# Patient Record
Sex: Female | Born: 1990 | Race: White | Hispanic: No | Marital: Married
Health system: Midwestern US, Community
[De-identification: ages and names within clinical notes are randomized; demographics above are authoritative.]

## PROBLEM LIST (undated history)

## (undated) DIAGNOSIS — N3000 Acute cystitis without hematuria: Secondary | ICD-10-CM

---

## 2017-06-25 ENCOUNTER — Emergency Department

## 2017-06-25 ENCOUNTER — Emergency Department: Admit: 2017-06-25 | Payer: TRICARE (CHAMPUS) | Primary: Family Medicine

## 2017-06-25 ENCOUNTER — Inpatient Hospital Stay
Admit: 2017-06-25 | Discharge: 2017-06-25 | Disposition: A | Discharge status: Home / Self Care | Payer: TRICARE (CHAMPUS) | Attending: Emergency Medicine

## 2017-06-25 DIAGNOSIS — O2312 Infections of bladder in pregnancy, second trimester: Secondary | ICD-10-CM

## 2017-06-25 LAB — CBC WITH AUTOMATED DIFF
ABS. BASOPHILS: 0 10*3/uL (ref 0.0–0.1)
ABS. EOSINOPHILS: 0.1 10*3/uL (ref 0.0–0.4)
ABS. IMM. GRANS.: 0 10*3/uL (ref 0.00–0.04)
ABS. LYMPHOCYTES: 1.5 10*3/uL (ref 0.8–3.5)
ABS. MONOCYTES: 0.4 10*3/uL (ref 0.0–1.0)
ABS. NEUTROPHILS: 5.5 10*3/uL (ref 1.8–8.0)
ABSOLUTE NRBC: 0 10*3/uL (ref 0.00–0.01)
BASOPHILS: 0 % (ref 0–1)
EOSINOPHILS: 2 % (ref 0–7)
HCT: 34.9 % — ABNORMAL LOW (ref 35.0–47.0)
HGB: 11.8 g/dL (ref 11.5–16.0)
IMMATURE GRANULOCYTES: 0 % (ref 0.0–0.5)
LYMPHOCYTES: 20 % (ref 12–49)
MCH: 32.3 PG (ref 26.0–34.0)
MCHC: 33.8 g/dL (ref 30.0–36.5)
MCV: 95.6 FL (ref 80.0–99.0)
MONOCYTES: 5 % (ref 5–13)
MPV: 9.5 FL (ref 8.9–12.9)
NEUTROPHILS: 73 % (ref 32–75)
NRBC: 0 PER 100 WBC
PLATELET: 180 10*3/uL (ref 150–400)
RBC: 3.65 M/uL — ABNORMAL LOW (ref 3.80–5.20)
RDW: 12.4 % (ref 11.5–14.5)
WBC: 7.5 10*3/uL (ref 3.6–11.0)

## 2017-06-25 LAB — URINALYSIS W/ RFLX MICROSCOPIC
Bilirubin: NEGATIVE
Blood: NEGATIVE
Glucose: NEGATIVE mg/dL
Ketone: NEGATIVE mg/dL
Nitrites: NEGATIVE
Protein: NEGATIVE mg/dL
Specific gravity: 1.013 (ref 1.003–1.030)
Urobilinogen: 0.2 EU/dL (ref 0.2–1.0)
pH (UA): 5.5 (ref 5.0–8.0)

## 2017-06-25 LAB — METABOLIC PANEL, COMPREHENSIVE
A-G Ratio: 0.8 — ABNORMAL LOW (ref 1.1–2.2)
ALT (SGPT): 42 U/L (ref 12–78)
AST (SGOT): 25 U/L (ref 15–37)
Albumin: 3 g/dL — ABNORMAL LOW (ref 3.5–5.0)
Alk. phosphatase: 51 U/L (ref 45–117)
Anion gap: 6 mmol/L (ref 5–15)
BUN/Creatinine ratio: 20 (ref 12–20)
BUN: 13 MG/DL (ref 6–20)
Bilirubin, total: 0.1 MG/DL — ABNORMAL LOW (ref 0.2–1.0)
CO2: 25 mmol/L (ref 21–32)
Calcium: 8.5 MG/DL (ref 8.5–10.1)
Chloride: 109 mmol/L — ABNORMAL HIGH (ref 97–108)
Creatinine: 0.65 MG/DL (ref 0.55–1.02)
GFR est AA: >60 mL/min/{1.73_m2} (ref >60)
GFR est non-AA: >60 mL/min/{1.73_m2} (ref >60)
Globulin: 3.9 g/dL (ref 2.0–4.0)
Glucose: 76 mg/dL (ref 65–100)
Potassium: 3.8 mmol/L (ref 3.5–5.1)
Protein, total: 6.9 g/dL (ref 6.4–8.2)
Sodium: 140 mmol/L (ref 136–145)

## 2017-06-25 LAB — SAMPLES BEING HELD

## 2017-06-25 LAB — BETA HCG, QT
Beta HCG, QT: 24396 m[IU]/mL — ABNORMAL HIGH (ref 0–6)
hCG Quant: 24396 m[IU]/mL — ABNORMAL HIGH (ref 0–6)

## 2017-06-25 LAB — RUPTURE OF FETAL MEMBRANES, POC
Kit Lot No.: 56006800
Rupture of fetal membrane: NEGATIVE

## 2017-06-25 LAB — URINE CULTURE HOLD SAMPLE

## 2017-06-25 MED ORDER — CEPHALEXIN 500 MG CAP
500 mg | ORAL_CAPSULE | Freq: Four times a day (QID) | ORAL | 0 refills | Status: AC
Start: 2017-06-25 — End: 2017-07-02

## 2017-06-25 NOTE — ED Notes (Signed)
Discharge instructions reviewed by provider; pt verbalized understanding of discharge and medication use. Vitals updated, IV DC'ed and pt ambulated from department with steady gait. No apparent distress noted.

## 2017-06-25 NOTE — ED Provider Notes (Signed)
26 y.o. female with past medical history significant for mild hypothyroidism who presents from home with chief complaint of pregnancy problem.  Patient states that she is currently 17.[redacted] weeks pregnant.  She complains that she has been "leaking a clear fluid" for the past 2 days and believes this fluid to be leaking vaginally.  She states that she "does not feel as if she is using the bathroom" and only finds the fluid when checking her pants.  She notes that the fluid is present in her underwear everytime she uses the restroom and checks her underwear.  She states that the leakage causes her "underwear to be very wet."  She denies recent cramping and notes that this does not feel like she is having contractions.  She does mention experiencing some "stretching sensations" throughout her pregnancy.  Of note, patient is from [place] and has not yet been evaluated by her OBGYN at home.  Patient denies fever, chills, nausea, vomiting, diarrhea, constipation, abdominal pain, and chest pain.  There are no other acute medical concerns at this time.    Social hx: negative tobacco use; negative alcohol use; negative drug use  PCP: No primary care provider on file.    Note written by Richardean ChimeraFranklin Gergoudis, Scribe, as dictated by Thornton PapasEljaiek, Delton Stelle F, MD 12:33 PM        The history is provided by the patient and the spouse. No language interpreter was used.        Past Medical History:   Diagnosis Date   ??? Hypothyroid in pregnancy, antepartum        Past Surgical History:   Procedure Laterality Date   ??? HX CYST REMOVAL Right     right wrist         History reviewed. No pertinent family history.    Social History     Socioeconomic History   ??? Marital status: Not on file     Spouse name: Not on file   ??? Number of children: Not on file   ??? Years of education: Not on file   ??? Highest education level: Not on file   Social Needs   ??? Financial resource strain: Not on file   ??? Food insecurity - worry: Not on file    ??? Food insecurity - inability: Not on file   ??? Transportation needs - medical: Not on file   ??? Transportation needs - non-medical: Not on file   Occupational History   ??? Not on file   Tobacco Use   ??? Smoking status: Never Smoker   ??? Smokeless tobacco: Never Used   Substance and Sexual Activity   ??? Alcohol use: No     Frequency: Never   ??? Drug use: No   ??? Sexual activity: Not on file   Other Topics Concern   ??? Not on file   Social History Narrative   ??? Not on file         ALLERGIES: Patient has no known allergies.    Review of Systems   Constitutional: Negative for chills, diaphoresis and fever.   HENT: Negative for congestion, postnasal drip, rhinorrhea and sore throat.    Eyes: Negative for photophobia, discharge, redness and visual disturbance.   Respiratory: Negative for cough, chest tightness, shortness of breath and wheezing.    Cardiovascular: Negative for chest pain, palpitations and leg swelling.   Gastrointestinal: Negative for abdominal distention, abdominal pain, blood in stool, constipation, diarrhea, nausea and vomiting.   Genitourinary: Positive for vaginal  discharge ("Leaking clear fluid"). Negative for difficulty urinating, dysuria, frequency, hematuria and urgency.   Musculoskeletal: Negative for arthralgias, back pain, joint swelling and myalgias.   Skin: Negative for color change and rash.   Neurological: Negative for dizziness, speech difficulty, weakness, light-headedness, numbness and headaches.   Psychiatric/Behavioral: Negative for confusion. The patient is not nervous/anxious.    All other systems reviewed and are negative.      Vitals:    06/25/17 1223 06/25/17 1237   Pulse: (!) 104 92   Resp:  16   Temp:  98.5 ??F (36.9 ??C)   SpO2: 100% 100%   Weight:  90.7 kg (200 lb)   Height:  5\' 10"  (1.778 m)            Physical Exam   Constitutional: She is oriented to person, place, and time. She appears well-developed and well-nourished. No distress.   HENT:   Head: Normocephalic and atraumatic.    Right Ear: External ear normal.   Left Ear: External ear normal.   Nose: Nose normal.   Mouth/Throat: Oropharynx is clear and moist.   Eyes: Conjunctivae and EOM are normal. Pupils are equal, round, and reactive to light. No scleral icterus.   Neck: Normal range of motion. Neck supple. No JVD present. No tracheal deviation present. No thyromegaly present.   Cardiovascular: Normal rate, regular rhythm and normal heart sounds. Exam reveals no gallop and no friction rub.   No murmur heard.  Pulmonary/Chest: Effort normal and breath sounds normal. No respiratory distress. She has no wheezes. She has no rales. She exhibits no tenderness.   Abdominal: Soft. Bowel sounds are normal. She exhibits no distension and no mass. There is no tenderness. There is no rebound and no guarding.   Pelvic deferred after amniotic fluid testing result was negative   Musculoskeletal: Normal range of motion. She exhibits no edema or tenderness.   Lymphadenopathy:     She has no cervical adenopathy.   Neurological: She is alert and oriented to person, place, and time. She has normal strength. She displays no atrophy and no tremor. No cranial nerve deficit. She exhibits normal muscle tone. Coordination and gait normal.   Skin: Skin is warm and dry. No rash noted. She is not diaphoretic. No erythema.   Psychiatric: She has a normal mood and affect. Her behavior is normal. Judgment and thought content normal.   Nursing note and vitals reviewed.     Note written by Richardean ChimeraFranklin Gergoudis, Scribe, as dictated by Thornton PapasEljaiek, Nitisha Civello F, MD 12:33 PM      MDM  Number of Diagnoses or Management Options  Diagnosis management comments: [place]  Impression: 26 year old female approximate 17 weeks gravid presents to the emergency department with complaint of clear fluid which he thinks is draining from her vagina. This occurs when she goes to the bathroom to urinate. Additionally she feels that this is coming in between episodes of urination.     Differential includes premature rupture of membranes, plan care will be baseline labs and transvaginal ultrasound. Pelvic exam to follow.         Procedures    PROGRESS NOTE:  3:39 PM  Amniotic fluid test by OB nurse was negative.

## 2017-06-25 NOTE — ED Triage Notes (Signed)
Pt to ED for c/o pregnancy problem. Pt reports leaking clear, non-odorous fluid for the past 2 days. Denies cramping or bleeding. Pt is [redacted] weeks along.

## 2020-07-24 IMAGING — MR MRI LSPINE WO CONTRAST
5 of 7 series · 32 of 48 positions shown · non-contrast
Comparison: None.

INDICATION: Low back pain with radiculopathy.
TECHNIQUE: Multiplanar, multiecho MR imaging of the lumbar spine was performed, including T1-weighted and fluid sensitive sequences without intravenous contrast.

[Series 16: t2_sag · sagittal · 4.0mm · 0.74mm/px · 6 of 19 slices shown]
[im 1/19]
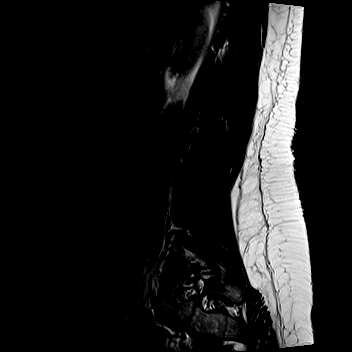
[im 4/19]
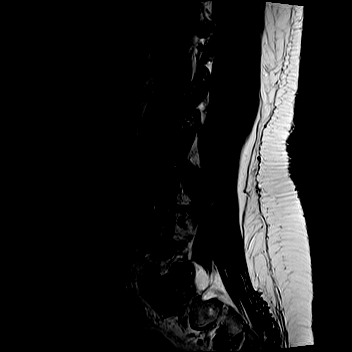
[im 8/19]
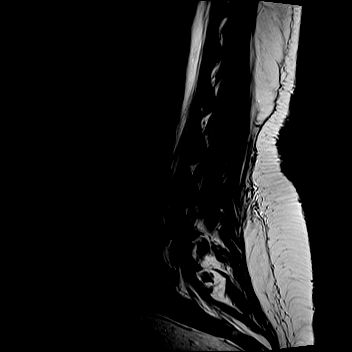
[im 11/19]
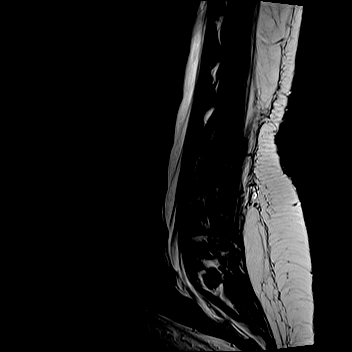
[im 15/19]
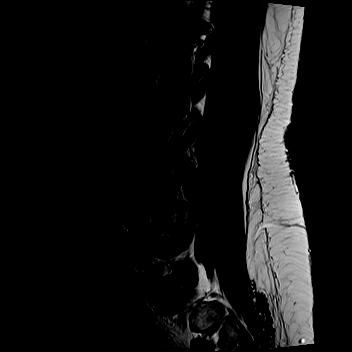
[im 19/19]
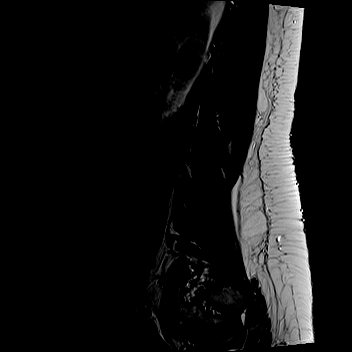

[Series 17: t1_sag · sagittal · 4.0mm · 0.81mm/px · 7 of 19 slices shown]
[im 1/19]
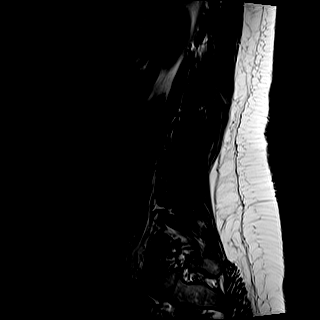
[im 4/19]
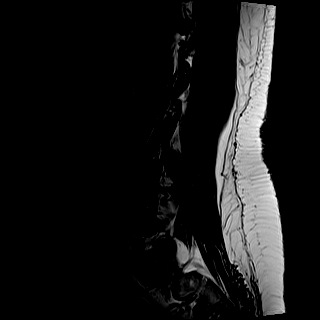
[im 7/19]
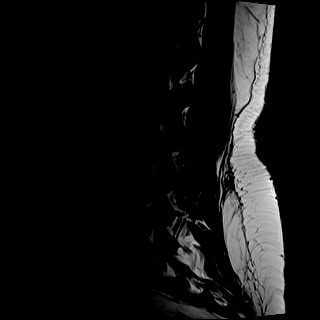
[im 10/19]
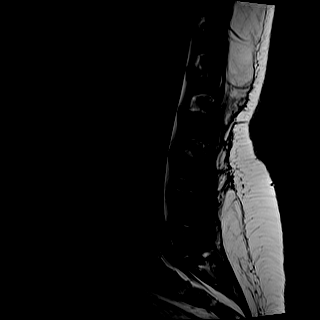
[im 13/19]
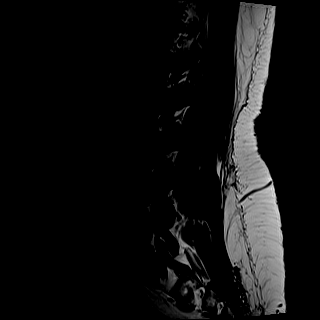
[im 16/19]
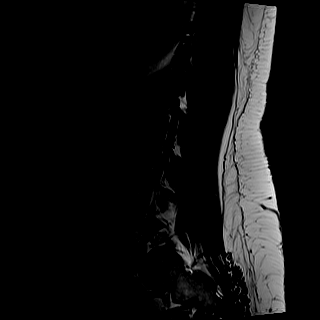
[im 19/19]
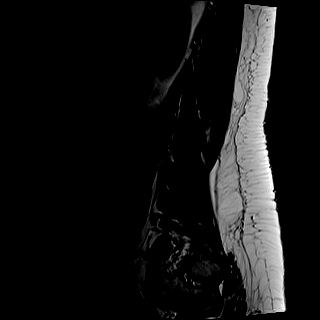

[Series 18: ir_sag · sagittal · 4.0mm · 0.45mm/px · 7 of 19 slices shown]
[im 1/19]
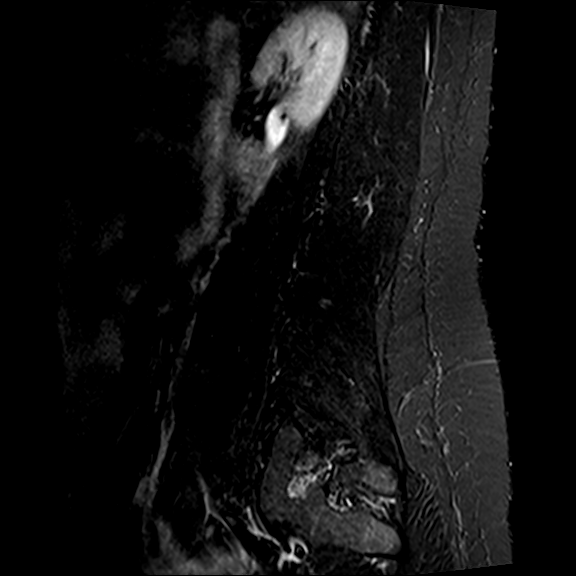
[im 4/19]
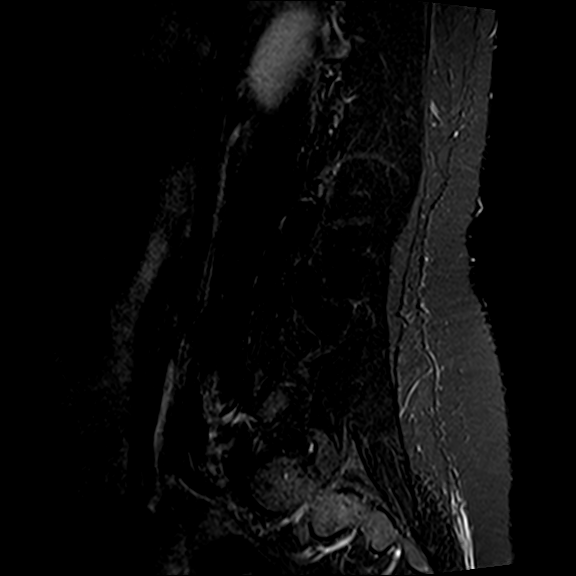
[im 7/19]
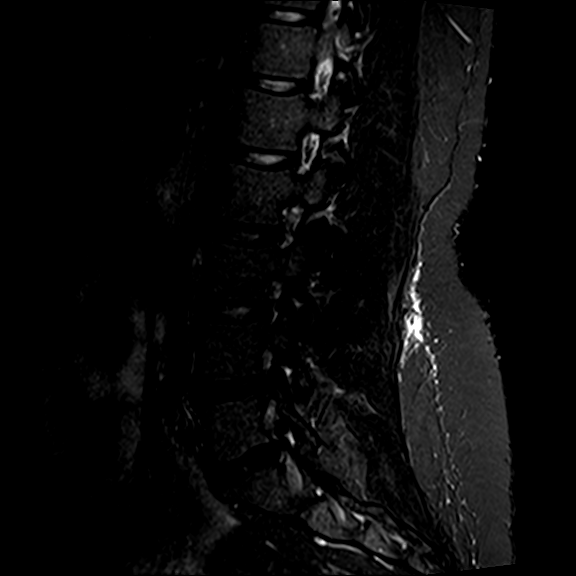
[im 10/19]
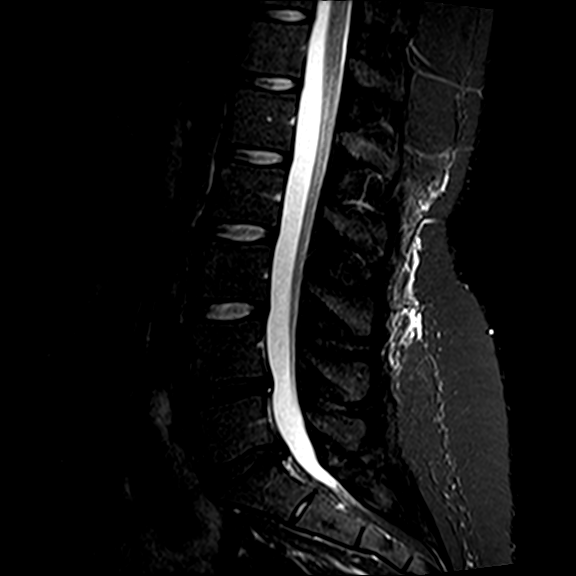
[im 13/19]
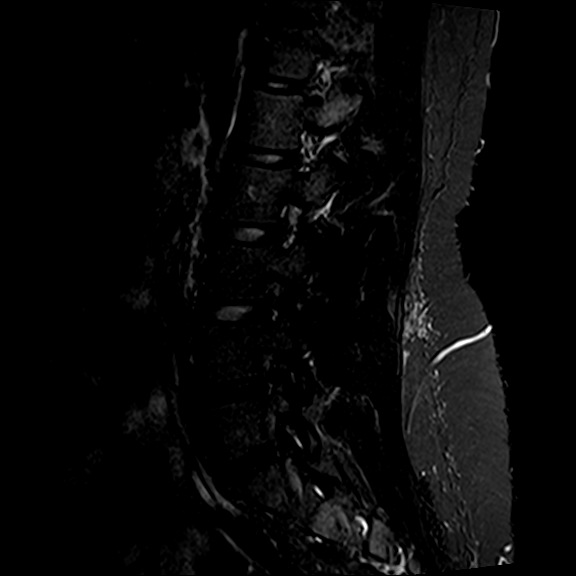
[im 16/19]
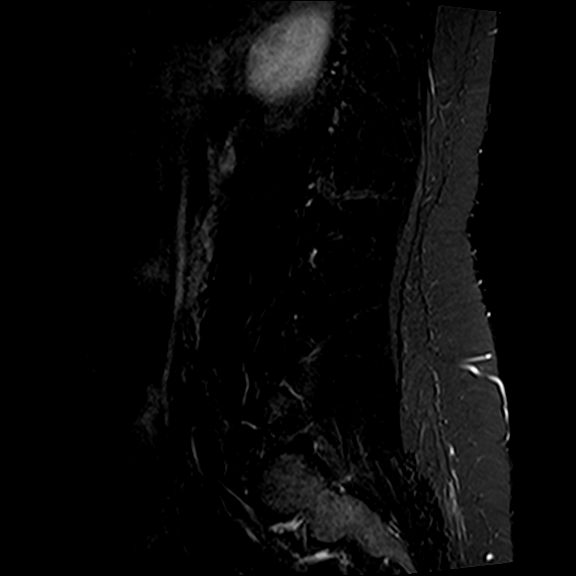
[im 19/19]
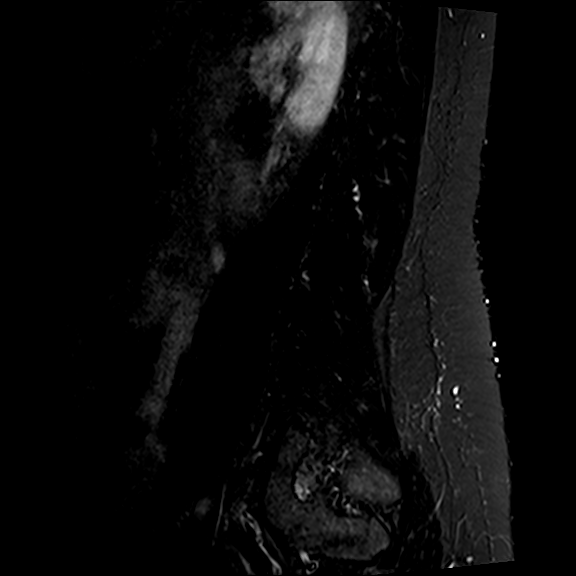

[Series 19: t2_axial · axial · 4.0mm · 0.62mm/px · z∈[-541,-345]mm · 10 of 44 slices shown]
[im 3/44]
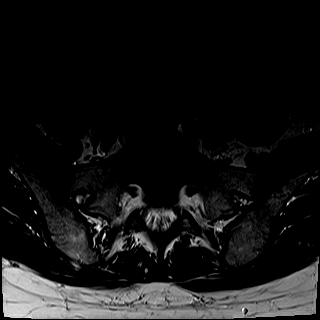
[im 6/44]
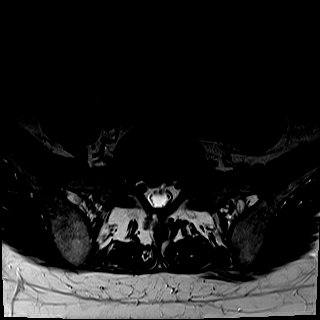
[im 9/44]
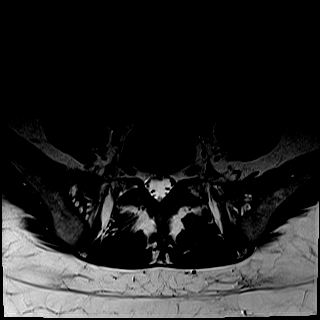
[im 15/44]
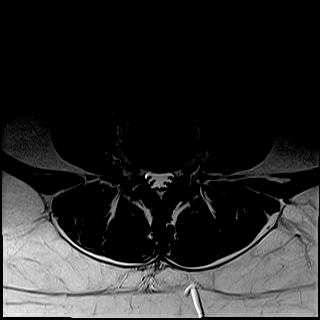
[im 21/44]
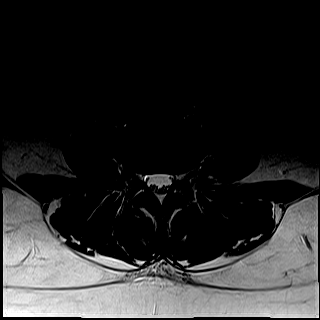
[im 23/44]
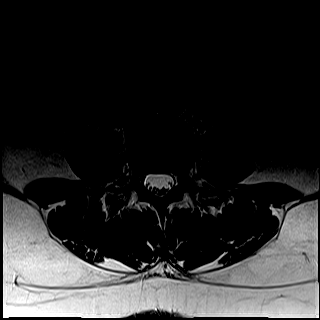
[im 26/44]
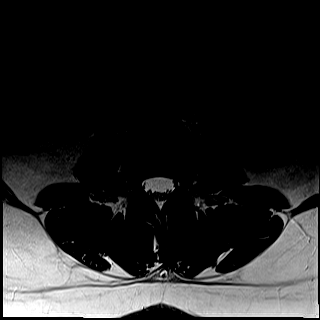
[im 32/44]
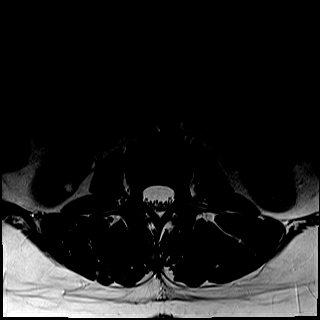
[im 38/44]
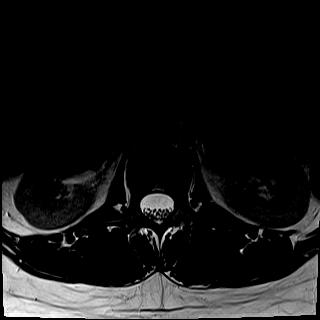
[im 44/44]
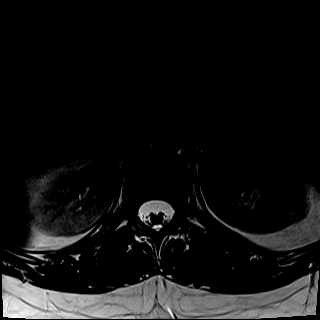

[Series 20: t1_axial_obl · axial · 3.0mm · 0.78mm/px · z∈[-566,-555]mm · 2 of 28 slices shown]
[im 1/28]
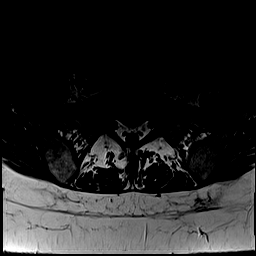
[im 4/28]
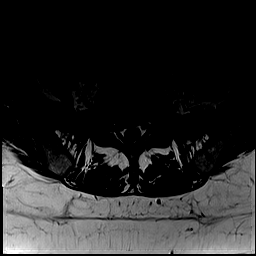

[32 of 48 positions shown; findings below may reference images not displayed]

FINDINGS: Conus: The conus is normal in appearance and position.  

Alignment: Minimal retrolisthesis of L4-L5 and L5-S1. 

Marrow: No acute fracture. No pars defect.  

T11-T12 and T12-L1 levels are seen only on the sagittal images; no posterior disc bulge, canal narrowing, or foraminal narrowing at these levels.

L1-L2: No disc protrusion, canal stenosis, or foraminal stenosis.

L2-L3: No disc protrusion, canal stenosis, or foraminal stenosis.

L3-L4: Minimal disc bulge. No canal or foraminal stenosis.

L4-L5: Minimal retrolisthesis. Mild disc bulge with annular tear. No facet arthrosis. Mild canal stenosis. No foraminal stenosis.

L5-S1: Minimal retrolisthesis. Moderate right paracentral disc extrusion with caudal extension along the posterior aspect of the S1 vertebral body. Impingement of the descending right S1 nerve root. Severe right lateral recess narrowing. Minimal canal stenosis. No facet arthrosis. No foraminal stenosis.

Visualized SI joints: Intact

Visualized soft tissues: Unremarkable
IMPRESSION: 1. Moderate right paracentral disc extrusion at L5-S1 with impingement of the descending right S1 nerve root and severe right lateral recess narrowing.

2. Mild disc bulge with annular tear at L4-L5, resulting in mild canal stenosis.

3. Minimal retrolisthesis of L4-L5 and L5-S1.

## 2021-05-15 IMAGING — MR MRI KNEE RT WO/W CONTRAST
6 of 7 series · 35 of 40 positions shown · IV contrast (prohance)
Comparison: None

HISTORY: Superficial injury right knee, fell approximately 1 week ago
TECHNIQUE: MRI right knee without and with 15 cc ProHance

[Series 8: t2_axial_fs · axial · right · 3.0mm · 0.47mm/px · z∈[-57,+69]mm · 6 of 33 slices shown]
[im 1/33]
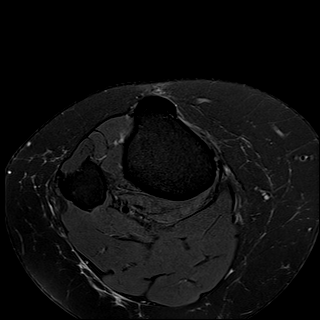
[im 7/33]
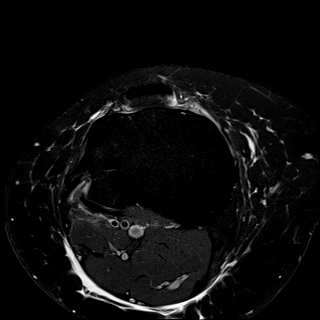
[im 13/33]
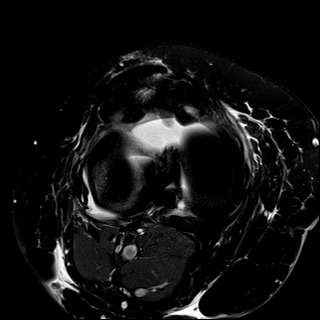
[im 20/33]
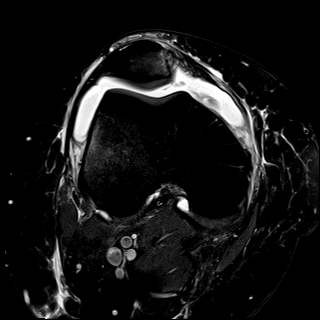
[im 26/33]
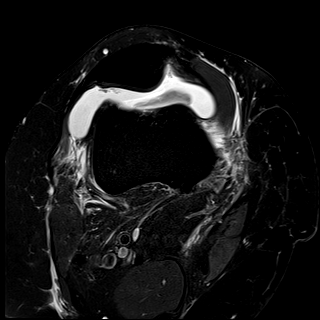
[im 33/33]
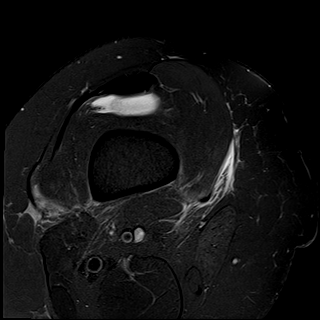

[Series 10: t1_cor · coronal · right · 3.5mm · 0.48mm/px · 6 of 30 slices shown]
[im 1/30]
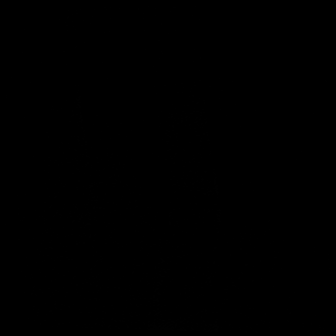
[im 6/30]
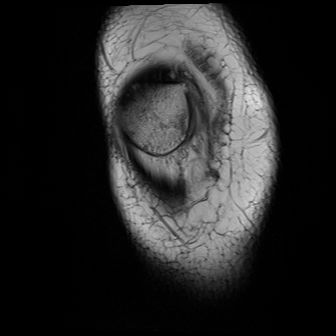
[im 12/30]
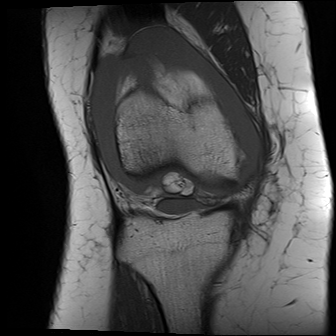
[im 18/30]
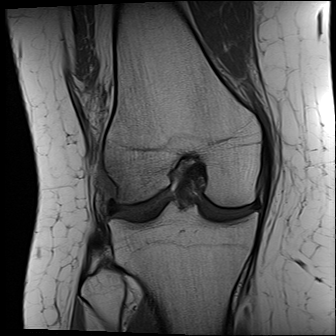
[im 24/30]
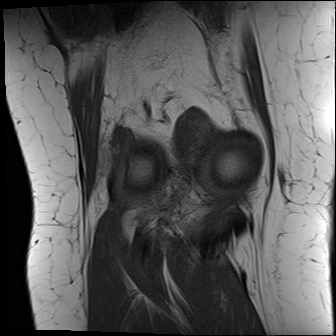
[im 30/30]
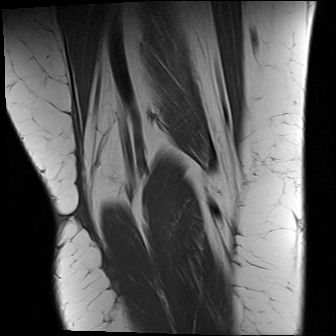

[Series 11: t2_cor_fs · coronal · right · 3.5mm · 0.56mm/px · 6 of 30 slices shown]
[im 1/30]
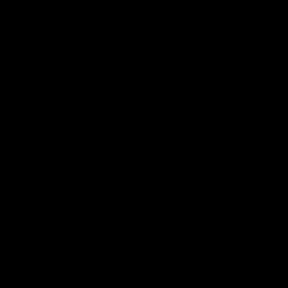
[im 6/30]
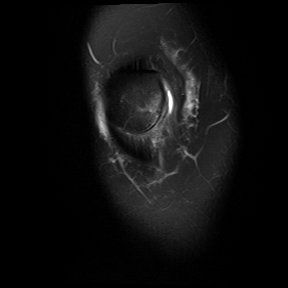
[im 12/30]
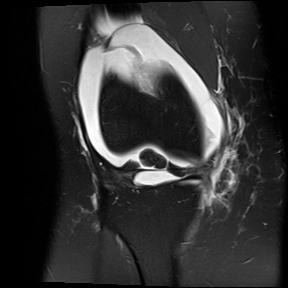
[im 18/30]
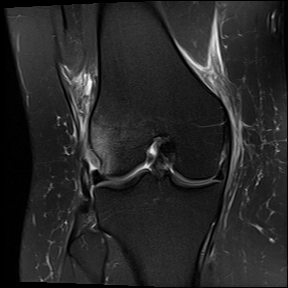
[im 24/30]
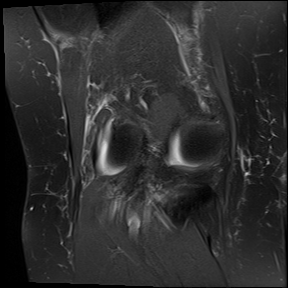
[im 30/30]
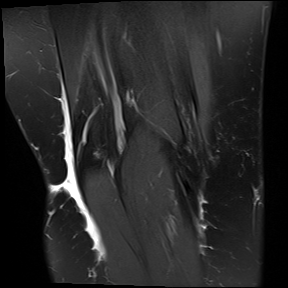

[Series 12: t1_axial_fs · axial · right · 3.0mm · 0.52mm/px · z∈[-59,+71]mm · 6 of 34 slices shown]
[im 1/34]
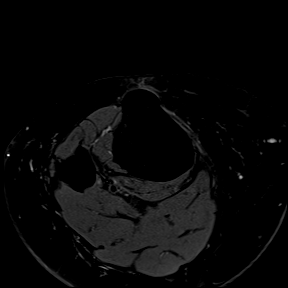
[im 7/34]
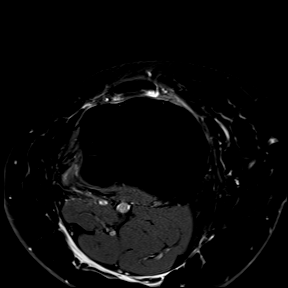
[im 14/34]
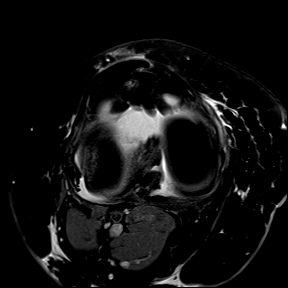
[im 20/34]
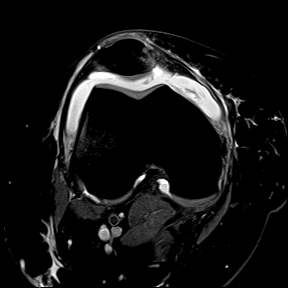
[im 27/34]
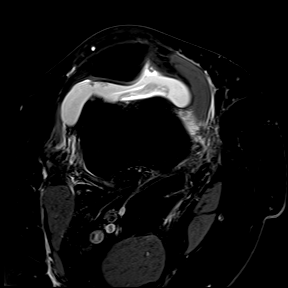
[im 34/34]
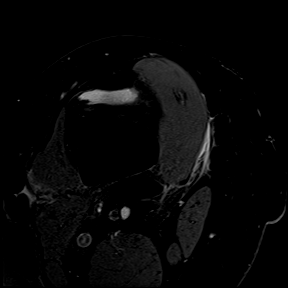

[Series 13: t1_axial_fs+c · axial · right · 3.0mm · 0.52mm/px · z∈[-59,+71]mm · 6 of 34 slices shown]
[im 1/34]
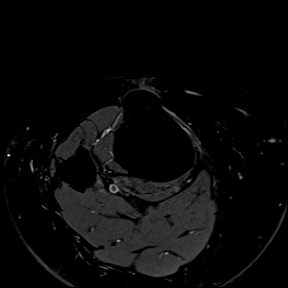
[im 7/34]
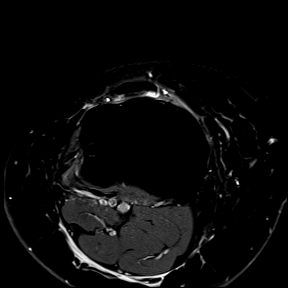
[im 14/34]
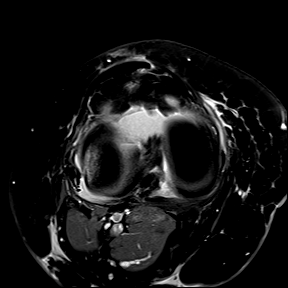
[im 20/34]
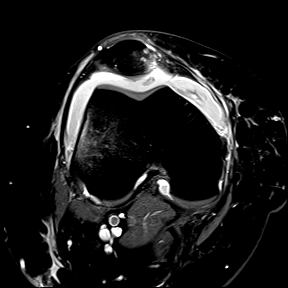
[im 27/34]
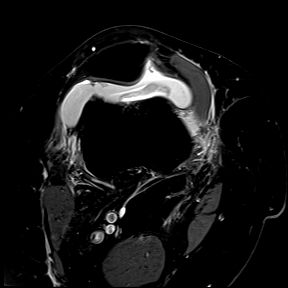
[im 34/34]
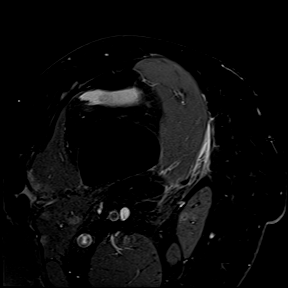

[Series 1016: pd_fs_sag · sagittal · right · 3.0mm · 0.25mm/px · 5 of 27 slices shown]
[im 1/27]
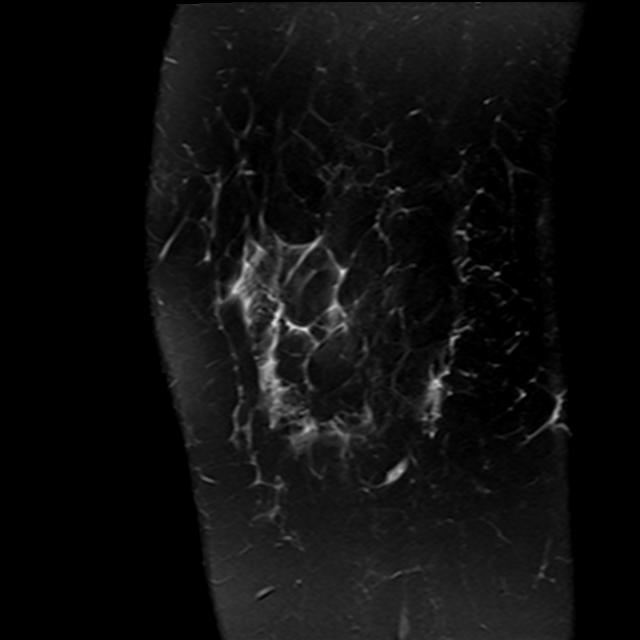
[im 7/27]
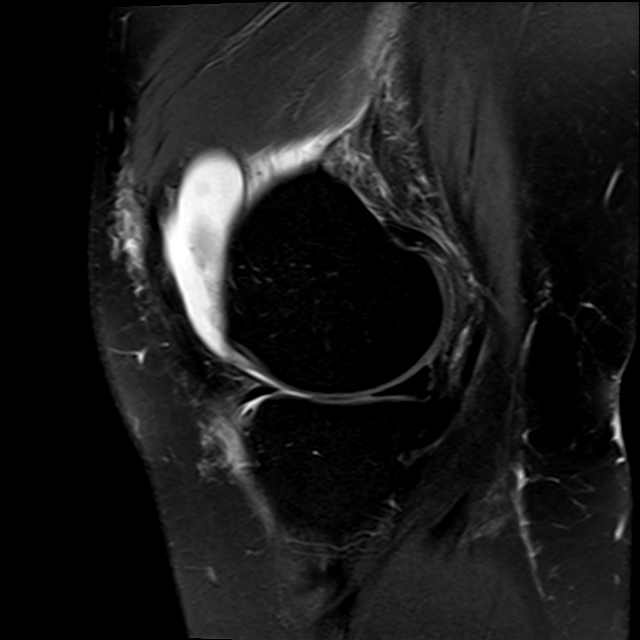
[im 14/27]
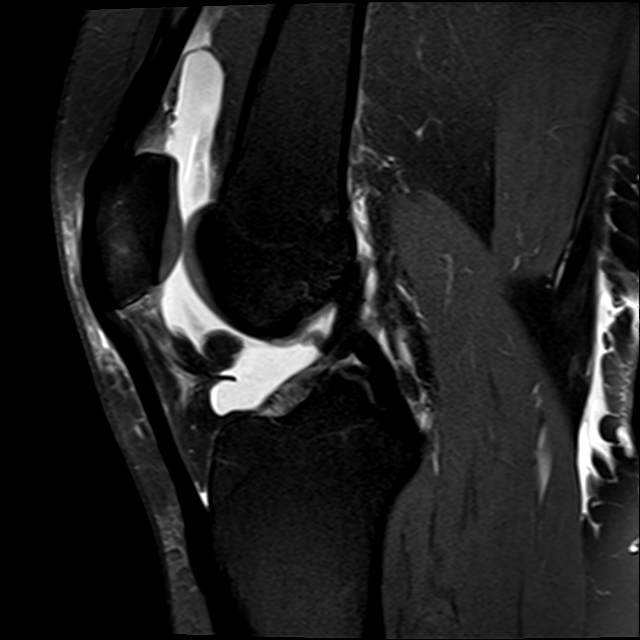
[im 20/27]
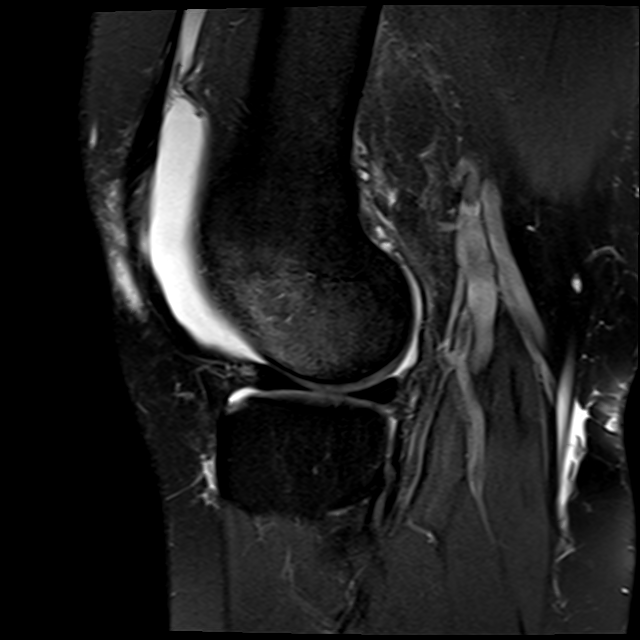
[im 27/27]
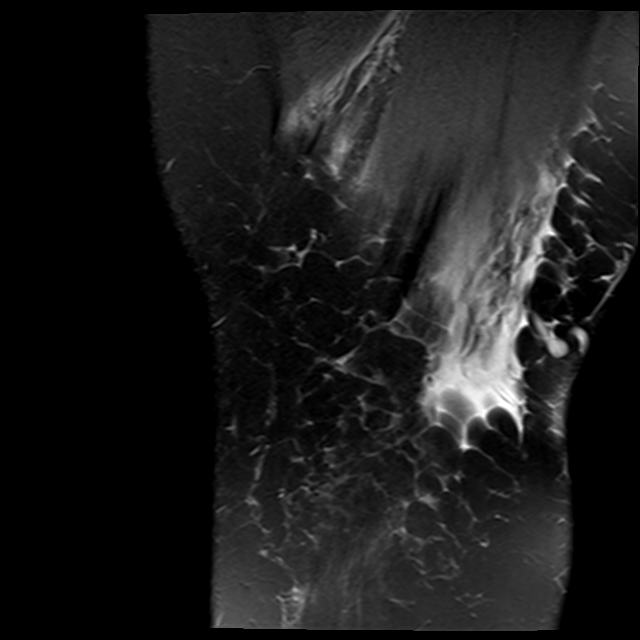

[35 of 40 positions shown; findings below may reference images not displayed]

FINDINGS: Findings of recent transient lateral patellar dislocation. Impaction fracture involving the medial border of the patella. Osseous contusion lateral aspect lateral femoral condyle. Rupture of the medial patellar retinaculum. Large amount of surrounding soft tissue edema. Large joint effusion. Large hemarthrosis.

Intact cruciate and collateral ligaments.

Intact medial and lateral menisci.

Quadriceps and patellar tendons are intact. Lateral patellar retinaculum is intact.

Articular surfaces appear adequately maintained.

Postcontrast imaging demonstrates no abnormal enhancing soft tissue lesion.
IMPRESSION: Recent transient lateral patellar dislocation with impaction fracture involving the medial border of the patella. Osseous contusion lateral aspect lateral femoral condyle. Rupture of the medial patellar retinaculum. Large hemarthrosis. Intact cruciate and collateral ligaments. Intact menisci. Recommend pediatric orthopedic consultation.

IMPORTANT FINDINGS!

Code fax

## 2021-06-05 IMAGING — MR MRI THIGH RT WO/W CONTRAST
3 of 11 series · 6 of 40 positions shown · IV contrast (15cc prohance)
Comparison: None.

INDICATION: Injury to right femur. Patient reports she is unable to fully extend her leg and is still in pain.
TECHNIQUE: Multiplanar, multiecho imaging of the right thigh was performed, including fluid sensitive and pre- and postcontrast T1-weighted sequences; IV contrast was administered to potentially improve disease detection. Contrast agent: 15 mL of ProHance administered intravenously prior to post-contrast imaging. eGFR: Per institutional protocol, eGFR not required in this patient.

Adverse events: No immediate patient complications were noted.

[Series 7: t1_cor_comp_filt · coronal · right · 5.6mm · 0.43mm/px · 2 of 42 slices shown]
[im 1/42]
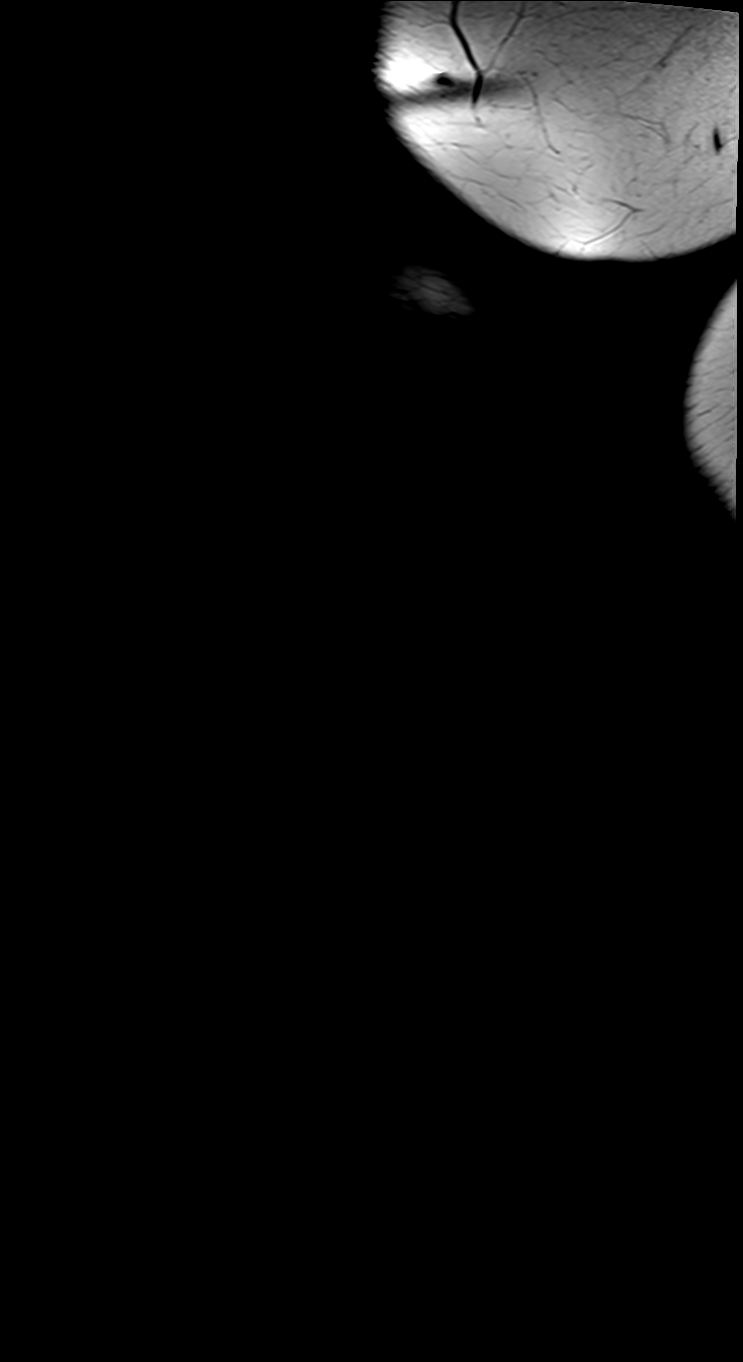
[im 42/42]
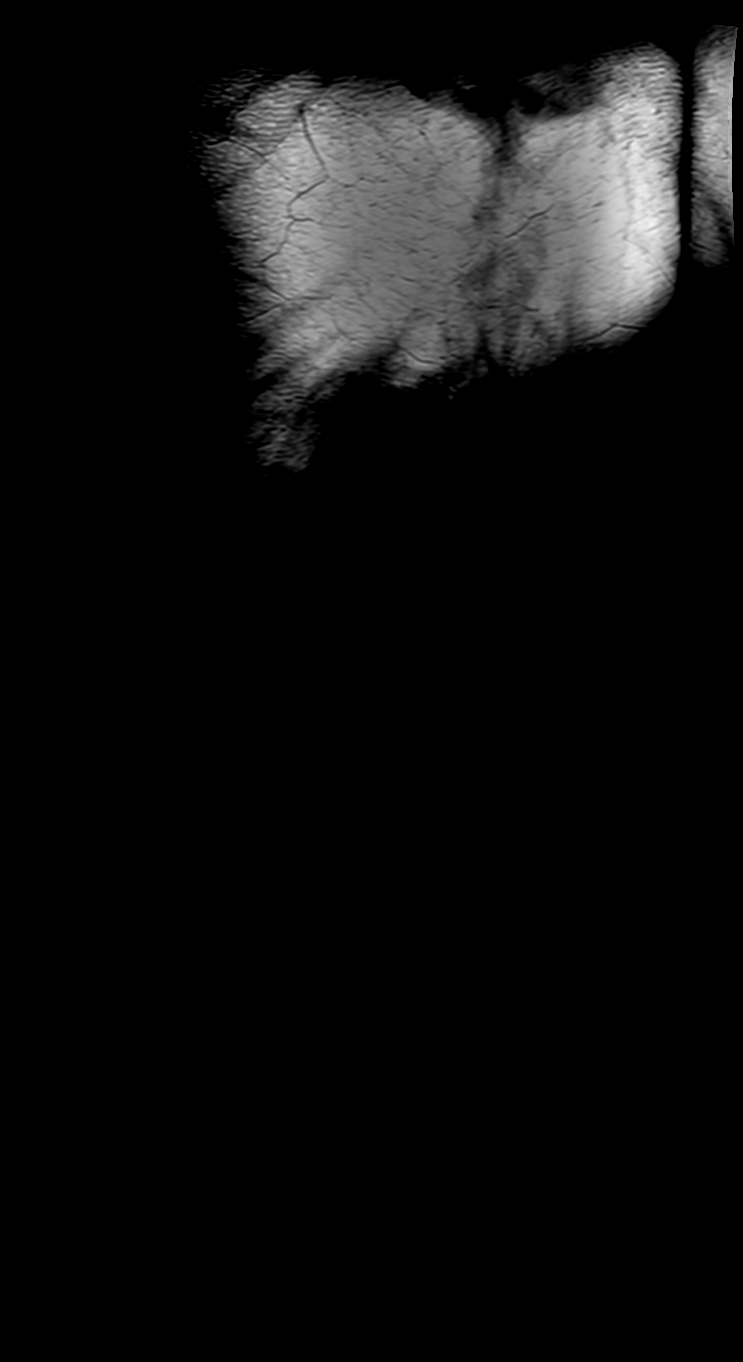

[Series 10: t2_cor_fs_comp · coronal · right · 5.6mm · 0.62mm/px · 2 of 42 slices shown]
[im 1/42]
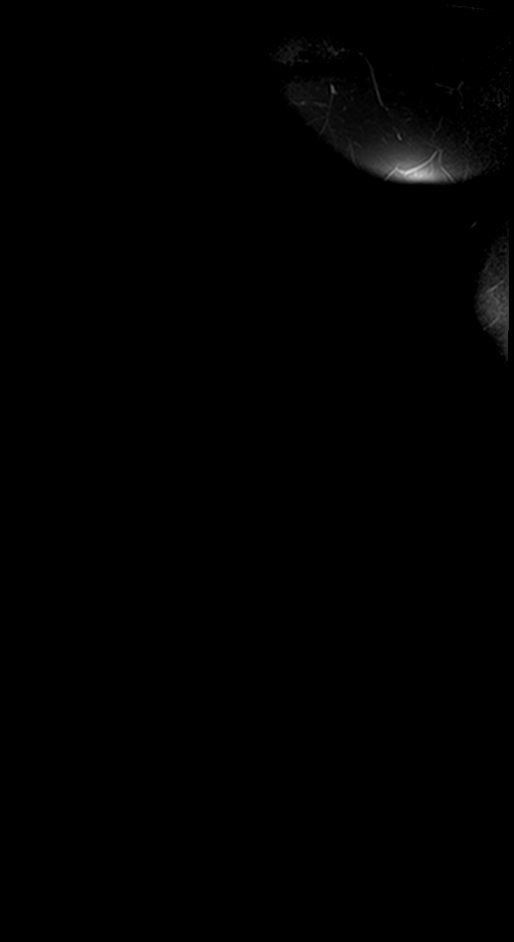
[im 42/42]
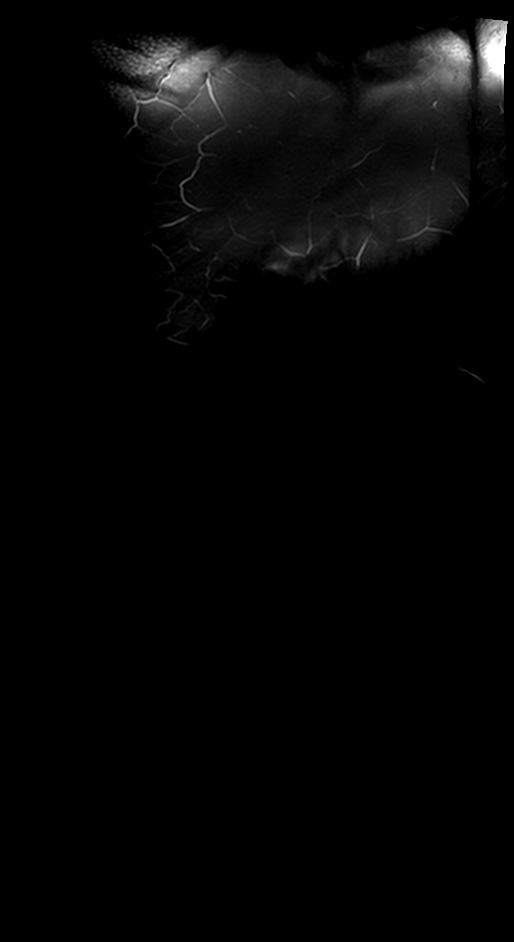

[Series 13: t1_sag_comp_filt · sagittal · right · 5.2mm · 0.42mm/px · 2 of 48 slices shown]
[im 1/48]
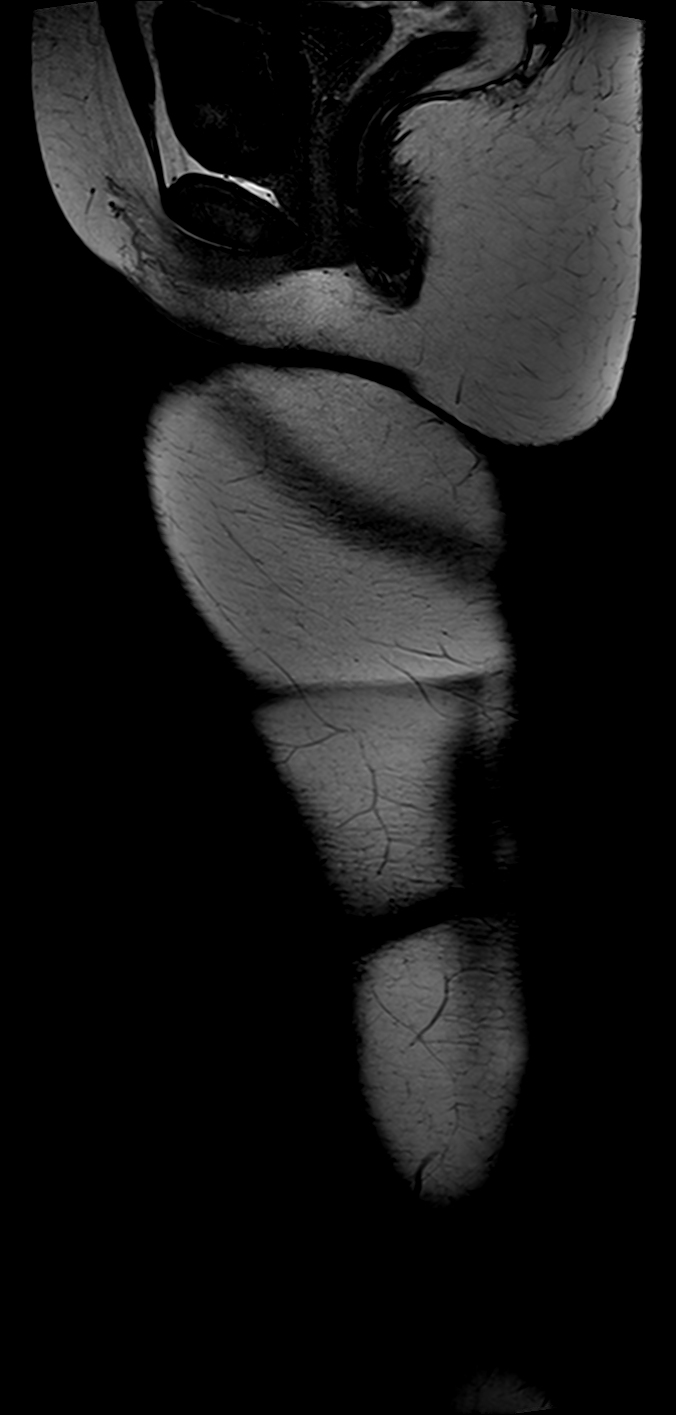
[im 24/48]
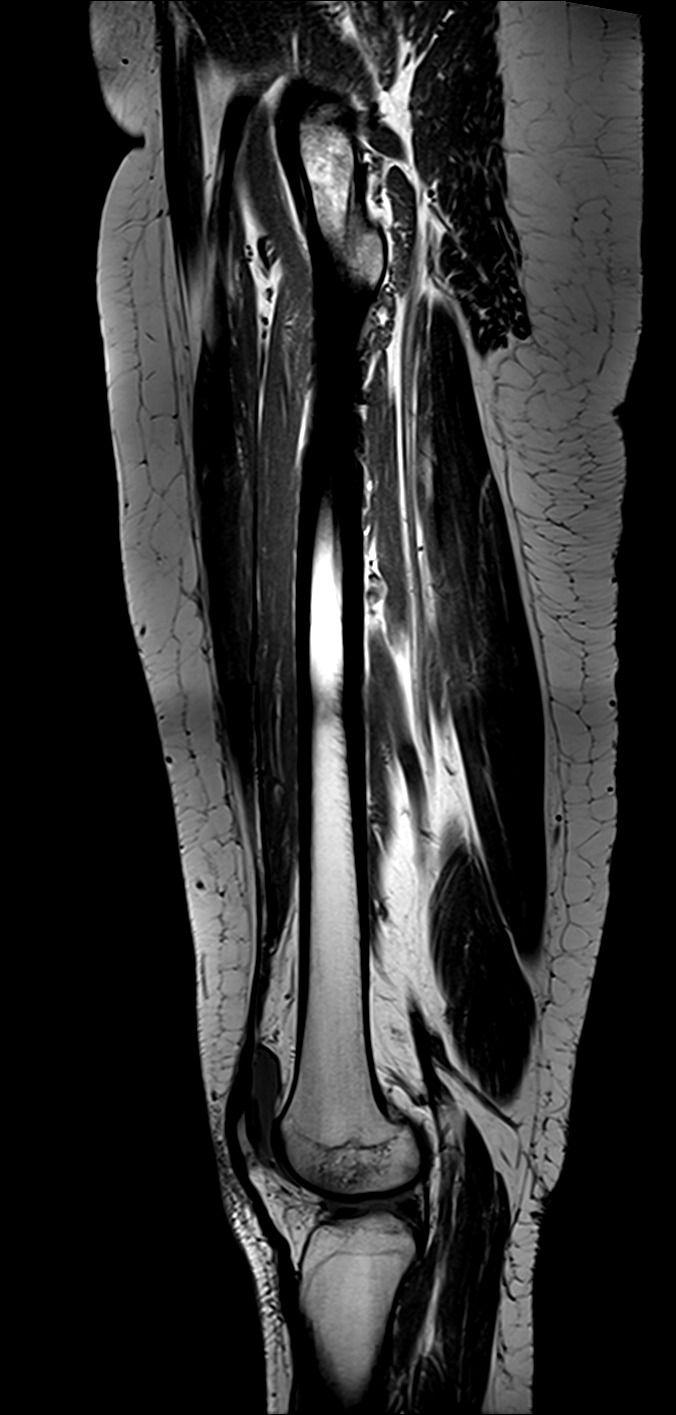

[6 of 40 positions shown; findings below may reference images not displayed]

FINDINGS: Subacute transient dislocation of the patella with subacute bone contusions at the medial patella facet and lateral aspect of the lateral femoral condyle. Large knee joint effusion. Mild acute strain of the distal vastus lateralis and vastus medialis muscles. No Baker's cyst. No obvious meniscal tear in the knee. No right hip joint effusion evident. No significant right hip arthropathy. Hamstring tendon origin is intact. No suspicious soft tissue mass. No fluid collection. No fluid in the intermuscular fascial planes. No right inguinal adenopathy or hernia. No suspicious postcontrast enhancement.
IMPRESSION: 1.
Subacute transient dislocation of the patella with subacute bone contusions at the medial patella facet and lateral aspect of the lateral femoral condyle.

2.
Mild acute strain of the distal vastus lateralis and vastus medialis muscles.
